# Patient Record
Sex: Female | Born: 1960 | Race: White | Hispanic: No | Marital: Married | Smoking: Never smoker
Health system: Southern US, Community
[De-identification: ages and names within clinical notes are randomized; demographics above are authoritative.]

## PROBLEM LIST (undated history)

## (undated) DIAGNOSIS — K635 Polyp of colon: Secondary | ICD-10-CM

## (undated) DIAGNOSIS — N200 Calculus of kidney: Secondary | ICD-10-CM

## (undated) HISTORY — PX: WISDOM TOOTH EXTRACTION: SHX21

## (undated) HISTORY — DX: Calculus of kidney: N20.0

## (undated) HISTORY — DX: Polyp of colon: K63.5

---

## 2005-05-29 ENCOUNTER — Ambulatory Visit: Payer: Self-pay | Admitting: Unknown Physician Specialty

## 2006-05-30 ENCOUNTER — Ambulatory Visit: Payer: Self-pay | Admitting: Unknown Physician Specialty

## 2006-06-05 ENCOUNTER — Ambulatory Visit: Payer: Self-pay | Admitting: Unknown Physician Specialty

## 2007-06-16 ENCOUNTER — Ambulatory Visit: Payer: Self-pay | Admitting: Unknown Physician Specialty

## 2008-06-18 ENCOUNTER — Ambulatory Visit: Payer: Self-pay | Admitting: Unknown Physician Specialty

## 2009-06-20 ENCOUNTER — Ambulatory Visit: Payer: Self-pay | Admitting: Unknown Physician Specialty

## 2009-09-03 HISTORY — PX: BREAST BIOPSY: SHX20

## 2010-07-21 ENCOUNTER — Ambulatory Visit: Payer: Self-pay | Admitting: Unknown Physician Specialty

## 2010-07-31 ENCOUNTER — Ambulatory Visit: Payer: Self-pay | Admitting: Unknown Physician Specialty

## 2010-09-11 ENCOUNTER — Ambulatory Visit: Payer: Self-pay | Admitting: General Surgery

## 2010-09-13 LAB — PATHOLOGY REPORT

## 2012-09-03 HISTORY — PX: BREAST SURGERY: SHX581

## 2013-07-02 ENCOUNTER — Ambulatory Visit: Payer: Self-pay | Admitting: Unknown Physician Specialty

## 2015-02-02 HISTORY — PX: COLONOSCOPY: SHX174

## 2015-05-17 LAB — HM MAMMOGRAPHY: HM Mammogram: NEGATIVE

## 2015-05-18 LAB — CBC AND DIFFERENTIAL
HCT: 39 % (ref 36–46)
HEMOGLOBIN: 13.4 g/dL (ref 12.0–16.0)
Neutrophils Absolute: 3 /uL
Platelets: 216 10*3/uL (ref 150–399)
WBC: 5 10^3/mL

## 2015-05-18 LAB — BASIC METABOLIC PANEL
BUN: 18 mg/dL (ref 4–21)
CREATININE: 0.8 mg/dL (ref 0.5–1.1)
Glucose: 88 mg/dL
POTASSIUM: 4.3 mmol/L (ref 3.4–5.3)
SODIUM: 138 mmol/L (ref 137–147)

## 2015-05-18 LAB — HEPATIC FUNCTION PANEL
ALT: 13 U/L (ref 7–35)
AST: 15 U/L (ref 13–35)
Alkaline Phosphatase: 69 U/L (ref 25–125)
Bilirubin, Total: 0.5 mg/dL

## 2015-05-18 LAB — HEMOGLOBIN A1C: HEMOGLOBIN A1C: 5.4

## 2015-05-18 LAB — LIPID PANEL
CHOLESTEROL: 193 mg/dL (ref 0–200)
HDL: 69 mg/dL (ref 35–70)
LDL Cholesterol: 106 mg/dL
TRIGLYCERIDES: 92 mg/dL (ref 40–160)

## 2015-05-18 LAB — TSH: TSH: 1.31 u[IU]/mL (ref 0.41–5.90)

## 2015-09-27 ENCOUNTER — Encounter: Payer: Self-pay | Admitting: Family Medicine

## 2015-09-27 ENCOUNTER — Ambulatory Visit (INDEPENDENT_AMBULATORY_CARE_PROVIDER_SITE_OTHER): Payer: 59 | Admitting: Family Medicine

## 2015-09-27 VITALS — BP 102/76 | HR 73 | Temp 97.9°F | Ht 64.5 in | Wt 122.2 lb

## 2015-09-27 DIAGNOSIS — Z87440 Personal history of urinary (tract) infections: Secondary | ICD-10-CM

## 2015-09-27 DIAGNOSIS — Z0000 Encounter for general adult medical examination without abnormal findings: Secondary | ICD-10-CM | POA: Insufficient documentation

## 2015-09-27 DIAGNOSIS — Z13220 Encounter for screening for lipoid disorders: Secondary | ICD-10-CM

## 2015-09-27 DIAGNOSIS — Z130 Encounter for screening for diseases of the blood and blood-forming organs and certain disorders involving the immune mechanism: Secondary | ICD-10-CM | POA: Diagnosis not present

## 2015-09-27 LAB — CBC
HEMATOCRIT: 40.8 % (ref 36.0–46.0)
HEMOGLOBIN: 13.4 g/dL (ref 12.0–15.0)
MCHC: 32.9 g/dL (ref 30.0–36.0)
MCV: 89 fl (ref 78.0–100.0)
PLATELETS: 215 10*3/uL (ref 150.0–400.0)
RBC: 4.58 Mil/uL (ref 3.87–5.11)
RDW: 13.8 % (ref 11.5–15.5)
WBC: 6.5 10*3/uL (ref 4.0–10.5)

## 2015-09-27 LAB — LIPID PANEL
Cholesterol: 179 mg/dL (ref 0–200)
HDL: 66.3 mg/dL (ref >39.00)
LDL Cholesterol: 94 mg/dL (ref 0–99)
NONHDL: 112.42
TRIGLYCERIDES: 91 mg/dL (ref 0.0–149.0)
Total CHOL/HDL Ratio: 3
VLDL: 18.2 mg/dL (ref 0.0–40.0)

## 2015-09-27 LAB — COMPREHENSIVE METABOLIC PANEL
ALT: 18 U/L (ref 0–35)
AST: 19 U/L (ref 0–37)
Albumin: 4.7 g/dL (ref 3.5–5.2)
Alkaline Phosphatase: 59 U/L (ref 39–117)
BUN: 16 mg/dL (ref 6–23)
CALCIUM: 9.7 mg/dL (ref 8.4–10.5)
CHLORIDE: 103 meq/L (ref 96–112)
CO2: 29 mEq/L (ref 19–32)
Creatinine, Ser: 0.76 mg/dL (ref 0.40–1.20)
GFR: 84.61 mL/min (ref >60.00)
Glucose, Bld: 98 mg/dL (ref 70–99)
Potassium: 5.4 mEq/L — ABNORMAL HIGH (ref 3.5–5.1)
Sodium: 138 mEq/L (ref 135–145)
Total Bilirubin: 0.5 mg/dL (ref 0.2–1.2)
Total Protein: 7.1 g/dL (ref 6.0–8.3)

## 2015-09-27 NOTE — Assessment & Plan Note (Signed)
Declines flu and Tdap. Pap smear and mammogram up-to-date. Colonoscopy also up-to-date. Screening lab work today.

## 2015-09-27 NOTE — Progress Notes (Signed)
Pre visit review using our clinic review tool, if applicable. No additional management support is needed unless otherwise documented below in the visit note. 

## 2015-09-27 NOTE — Patient Instructions (Signed)
It was nice to see you today.  We will call with your lab results.  Follow up annually or sooner if needed.  Take care  Dr. Lacinda Montoya  Health Maintenance, Female Adopting a healthy lifestyle and getting preventive care can go a long way to promote health and wellness. Talk with your health care provider about what schedule of regular examinations is right for you. This is a good chance for you to check in with your provider about disease prevention and staying healthy. In between checkups, there are plenty of things you can do on your own. Experts have done a lot of research about which lifestyle changes and preventive measures are most likely to keep you healthy. Ask your health care provider for more information. WEIGHT AND DIET  Eat a healthy diet  Be sure to include plenty of vegetables, fruits, low-fat dairy products, and lean protein.  Do not eat a lot of foods high in solid fats, added sugars, or salt.  Get regular exercise. This is one of the most important things you can do for your health.  Most adults should exercise for at least 150 minutes each week. The exercise should increase your heart rate and make you sweat (moderate-intensity exercise).  Most adults should also do strengthening exercises at least twice a week. This is in addition to the moderate-intensity exercise.  Maintain a healthy weight  Body mass index (BMI) is a measurement that can be used to identify possible weight problems. It estimates body fat based on height and weight. Your health care provider can help determine your BMI and help you achieve or maintain a healthy weight.  For females 73 years of age and older:   A BMI below 18.5 is considered underweight.  A BMI of 18.5 to 24.9 is normal.  A BMI of 25 to 29.9 is considered overweight.  A BMI of 30 and above is considered obese.  Watch levels of cholesterol and blood lipids  You should start having your blood tested for lipids and cholesterol  at 53 years of age, then have this test every 5 years.  You may need to have your cholesterol levels checked more often if:  Your lipid or cholesterol levels are high.  You are older than 53 years of age.  You are at high risk for heart disease.  CANCER SCREENING   Lung Cancer  Lung cancer screening is recommended for adults 48-55 years old who are at high risk for lung cancer because of a history of smoking.  A yearly low-dose CT scan of the lungs is recommended for people who:  Currently smoke.  Have quit within the past 15 years.  Have at least a 30-pack-year history of smoking. A pack year is smoking an average of one pack of cigarettes a day for 1 year.  Yearly screening should continue until it has been 15 years since you quit.  Yearly screening should stop if you develop a health problem that would prevent you from having lung cancer treatment.  Breast Cancer  Practice breast self-awareness. This means understanding how your breasts normally appear and feel.  It also means doing regular breast self-exams. Let your health care provider know about any changes, no matter how small.  If you are in your 20s or 30s, you should have a clinical breast exam (CBE) by a health care provider every 1-3 years as part of a regular health exam.  If you are 55 or older, have a CBE every year. Also  consider having a breast X-ray (mammogram) every year.  If you have a family history of breast cancer, talk to your health care provider about genetic screening.  If you are at high risk for breast cancer, talk to your health care provider about having an MRI and a mammogram every year.  Breast cancer gene (BRCA) assessment is recommended for women who have family members with BRCA-related cancers. BRCA-related cancers include:  Breast.  Ovarian.  Tubal.  Peritoneal cancers.  Results of the assessment will determine the need for genetic counseling and BRCA1 and BRCA2  testing. Cervical Cancer Your health care provider may recommend that you be screened regularly for cancer of the pelvic organs (ovaries, uterus, and vagina). This screening involves a pelvic examination, including checking for microscopic changes to the surface of your cervix (Pap test). You may be encouraged to have this screening done every 3 years, beginning at age 31.  For women ages 67-65, health care providers may recommend pelvic exams and Pap testing every 3 years, or they may recommend the Pap and pelvic exam, combined with testing for human papilloma virus (HPV), every 5 years. Some types of HPV increase your risk of cervical cancer. Testing for HPV may also be done on women of any age with unclear Pap test results.  Other health care providers may not recommend any screening for nonpregnant women who are considered low risk for pelvic cancer and who do not have symptoms. Ask your health care provider if a screening pelvic exam is right for you.  If you have had past treatment for cervical cancer or a condition that could lead to cancer, you need Pap tests and screening for cancer for at least 20 years after your treatment. If Pap tests have been discontinued, your risk factors (such as having a new sexual partner) need to be reassessed to determine if screening should resume. Some women have medical problems that increase the chance of getting cervical cancer. In these cases, your health care provider may recommend more frequent screening and Pap tests. Colorectal Cancer  This type of cancer can be detected and often prevented.  Routine colorectal cancer screening usually begins at 53 years of age and continues through 53 years of age.  Your health care provider may recommend screening at an earlier age if you have risk factors for colon cancer.  Your health care provider may also recommend using home test kits to check for hidden blood in the stool.  A small camera at the end of a  tube can be used to examine your colon directly (sigmoidoscopy or colonoscopy). This is done to check for the earliest forms of colorectal cancer.  Routine screening usually begins at age 25.  Direct examination of the colon should be repeated every 5-10 years through 53 years of age. However, you may need to be screened more often if early forms of precancerous polyps or small growths are found. Skin Cancer  Check your skin from head to toe regularly.  Tell your health care provider about any new moles or changes in moles, especially if there is a change in a mole's shape or color.  Also tell your health care provider if you have a mole that is larger than the size of a pencil eraser.  Always use sunscreen. Apply sunscreen liberally and repeatedly throughout the day.  Protect yourself by wearing long sleeves, pants, a wide-brimmed hat, and sunglasses whenever you are outside. HEART DISEASE, DIABETES, AND HIGH BLOOD PRESSURE   High  blood pressure causes heart disease and increases the risk of stroke. High blood pressure is more likely to develop in:  People who have blood pressure in the high end of the normal range (130-139/85-89 mm Hg).  People who are overweight or obese.  People who are African American.  If you are 30-90 years of age, have your blood pressure checked every 3-5 years. If you are 83 years of age or older, have your blood pressure checked every year. You should have your blood pressure measured twice--once when you are at a hospital or clinic, and once when you are not at a hospital or clinic. Record the average of the two measurements. To check your blood pressure when you are not at a hospital or clinic, you can use:  An automated blood pressure machine at a pharmacy.  A home blood pressure monitor.  If you are between 66 years and 43 years old, ask your health care provider if you should take aspirin to prevent strokes.  Have regular diabetes screenings. This  involves taking a blood sample to check your fasting blood sugar level.  If you are at a normal weight and have a low risk for diabetes, have this test once every three years after 53 years of age.  If you are overweight and have a high risk for diabetes, consider being tested at a younger age or more often. PREVENTING INFECTION  Hepatitis B  If you have a higher risk for hepatitis B, you should be screened for this virus. You are considered at high risk for hepatitis B if:  You were born in a country where hepatitis B is common. Ask your health care provider which countries are considered high risk.  Your parents were born in a high-risk country, and you have not been immunized against hepatitis B (hepatitis B vaccine).  You have HIV or AIDS.  You use needles to inject street drugs.  You live with someone who has hepatitis B.  You have had sex with someone who has hepatitis B.  You get hemodialysis treatment.  You take certain medicines for conditions, including cancer, organ transplantation, and autoimmune conditions. Hepatitis C  Blood testing is recommended for:  Everyone born from [birth year] through 1963.  Anyone with known risk factors for hepatitis C. Sexually transmitted infections (STIs)  You should be screened for sexually transmitted infections (STIs) including gonorrhea and chlamydia if:  You are sexually active and are younger than 53 years of age.  You are older than 53 years of age and your health care provider tells you that you are at risk for this type of infection.  Your sexual activity has changed since you were last screened and you are at an increased risk for chlamydia or gonorrhea. Ask your health care provider if you are at risk.  If you do not have HIV, but are at risk, it may be recommended that you take a prescription medicine daily to prevent HIV infection. This is called pre-exposure prophylaxis (PrEP). You are considered at risk if:  You are  sexually active and do not regularly use condoms or know the HIV status of your partner(s).  You take drugs by injection.  You are sexually active with a partner who has HIV. Talk with your health care provider about whether you are at high risk of being infected with HIV. If you choose to begin PrEP, you should first be tested for HIV. You should then be tested every 3 months for as long  as you are taking PrEP.  PREGNANCY   If you are premenopausal and you may become pregnant, ask your health care provider about preconception counseling.  If you may become pregnant, take 400 to 800 micrograms (mcg) of folic acid every day.  If you want to prevent pregnancy, talk to your health care provider about birth control (contraception). OSTEOPOROSIS AND MENOPAUSE   Osteoporosis is a disease in which the bones lose minerals and strength with aging. This can result in serious bone fractures. Your risk for osteoporosis can be identified using a bone density scan.  If you are 20 years of age or older, or if you are at risk for osteoporosis and fractures, ask your health care provider if you should be screened.  Ask your health care provider whether you should take a calcium or vitamin D supplement to lower your risk for osteoporosis.  Menopause may have certain physical symptoms and risks.  Hormone replacement therapy may reduce some of these symptoms and risks. Talk to your health care provider about whether hormone replacement therapy is right for you.  HOME CARE INSTRUCTIONS   Schedule regular health, dental, and eye exams.  Stay current with your immunizations.   Do not use any tobacco products including cigarettes, chewing tobacco, or electronic cigarettes.  If you are pregnant, do not drink alcohol.  If you are breastfeeding, limit how much and how often you drink alcohol.  Limit alcohol intake to no more than 1 drink per day for nonpregnant women. One drink equals 12 ounces of beer, 5  ounces of wine, or 1 ounces of hard liquor.  Do not use street drugs.  Do not share needles.  Ask your health care provider for help if you need support or information about quitting drugs.  Tell your health care provider if you often feel depressed.  Tell your health care provider if you have ever been abused or do not feel safe at home.   This information is not intended to replace advice given to you by your health care provider. Make sure you discuss any questions you have with your health care provider.   Document Released: 03/05/2011 Document Revised: 09/10/2014 Document Reviewed: 07/22/2013 Elsevier Interactive Patient Education [employer].

## 2015-09-27 NOTE — Progress Notes (Signed)
 Subjective:  Patient ID: Lindsey Montoya, female    DOB: 02/23/1961  Age: 55 y.o. MRN: [id number]  CC: Establish care.  HPI Lindsey Montoya is a 55 y.o. female presents to the clinic today to establish care.  Preventative Healthcare  Pap smear: Up-to-date. June 2016. Followed by GYN.  Mammogram: June 2016. Up-to-date.  Colonoscopy: Up-to-date. Done earlier in 2016.  Immunizations  Tetanus - declines.  Pneumococcal - not indicated.  Flu - declines.  Zoster - not indicated  Hepatitis C screening - declines. Patient did not want screening lab work today but I encouraged her to get basic lab work today and she agreed to this.  Labs: See above.   Exercise: No regular exercise.  Alcohol use: See below.   Smoking/tobacco use: No.  STD/HIV testing: Declineds.  Regular dental exams: Yes.   Wears seat belt: Yes.   PMH, Surgical Hx, Family Hx, Social History reviewed and updated as below.  Past Medical History  Diagnosis Date  . Colon polyps   . Kidney stones    Past Surgical History  Procedure Laterality Date  . Breast surgery  2014    biopsy   Family History  Problem Relation Age of Onset  . Hypertension Mother   . Diabetes Mother   . Hypertension Father   . Breast cancer Paternal Grandmother    Social History  Substance Use Topics  . Smoking status: Never Smoker   . Smokeless tobacco: Never Used  . Alcohol Use: 0.0 - 1.2 oz/week    0-1 Standard drinks or equivalent, 0-1 Glasses of wine per week   Review of Systems Comprehensive review of systems was done today and was negative. See scanned document. Objective:   Today's Vitals: BP 102/76 mmHg  Pulse 73  Temp(Src) 97.9 F (36.6 C) (Oral)  Ht 5' 4.5" (1.638 m)  Wt 122 lb 4 oz (55.452 kg)  BMI 20.67 kg/m2  SpO2 100%  Physical Exam  Constitutional: She is oriented to person, place, and time. She appears well-developed and well-nourished. No distress.  HENT:  Head: Normocephalic and  atraumatic.  Nose: Nose normal.  Mouth/Throat: Oropharynx is clear and moist. No oropharyngeal exudate.  Normal TM's bilaterally.   Eyes: Conjunctivae are normal. No scleral icterus.  Neck: Neck supple. No thyromegaly present.  Cardiovascular: Normal rate and regular rhythm.   No murmur heard. Pulmonary/Chest: Effort normal and breath sounds normal. She has no wheezes. She has no rales.  Abdominal: Soft. She exhibits no distension. There is no tenderness. There is no rebound and no guarding.  Musculoskeletal: Normal range of motion. She exhibits no edema.  Lymphadenopathy:    She has no cervical adenopathy.  Neurological: She is alert and oriented to person, place, and time.  Skin: Skin is warm and dry. No rash noted.  Psychiatric: She has a normal mood and affect.  Vitals reviewed.  Assessment & Plan:   Problem List Items Addressed This Visit    Preventative health care    Declines flu and Tdap. Pap smear and mammogram up-to-date. Colonoscopy also up-to-date. Screening lab work today.       Other Visit Diagnoses    Screening for deficiency anemia    -  Primary    Relevant Orders    CBC (Completed)    Screening, lipid        Relevant Orders    Lipid panel (Completed)    History of UTI        Relevant Orders      Comp Met (CMET) (Completed)       Follow-up: Return in about 1 year (around 09/26/2016).  [place]

## 2015-10-19 ENCOUNTER — Encounter: Payer: Self-pay | Admitting: Family Medicine

## 2016-08-17 IMAGING — MG SCREENING-BILATERAL MAMMOGRAPHY
5 series · 5 of 5 positions shown · non-contrast
Comparison: none

_____________________________________________________

ALTO, ZAJK 07/18/1927 MILL, IBRAHIM MUHAMMED [DATE]
REASON FOR CONSULTATION: Last Menstral Date:...... POST MENSTRUAL Reason
for Consultation:. FOLLOW UP Comment to Radiology:....
_______________________________________________________
EXAM: PORTABLE CHEST
Nasogastric tube is present. Central venous line tip is in the area
just above the RIGHT atrium. The RIGHT lung is essentially clear. There
is abnormal opacity in the LEFT lung base obscuring the LEFT
hemidiaphragm and blunting of the LEFT costophrenic angle. This is
probable due to pleural effusion plus possibly infiltrate in the LEFT
lower lobe. The heart size is normal.

[R CC]
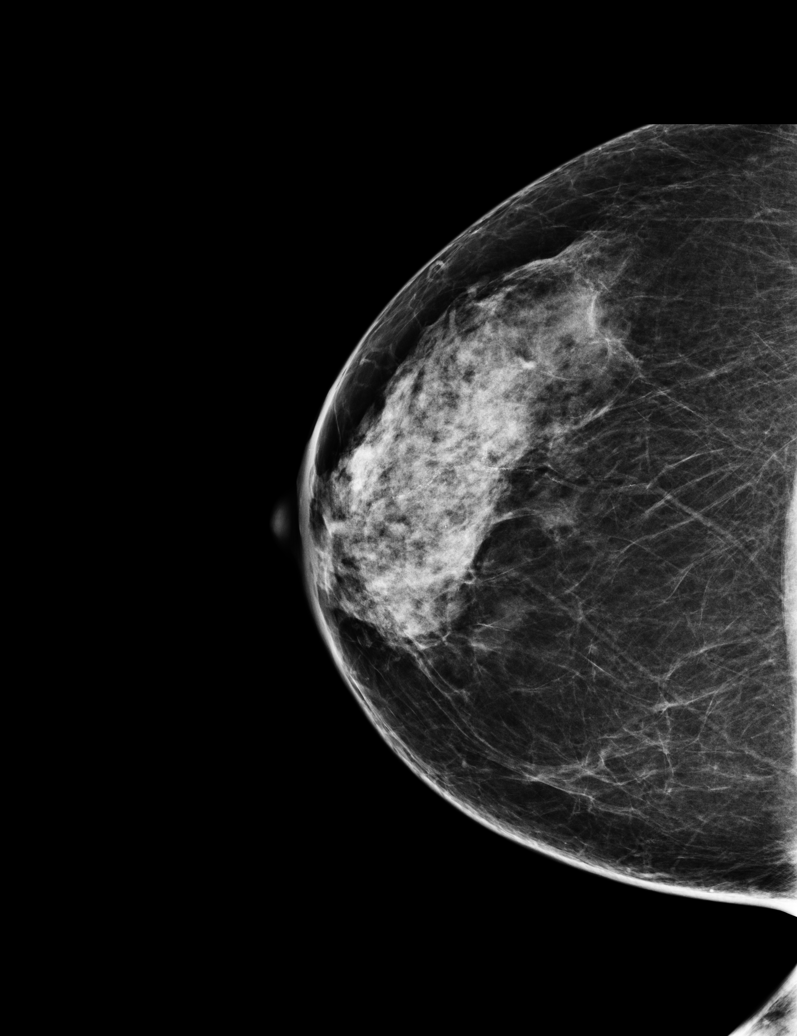

[L CC]
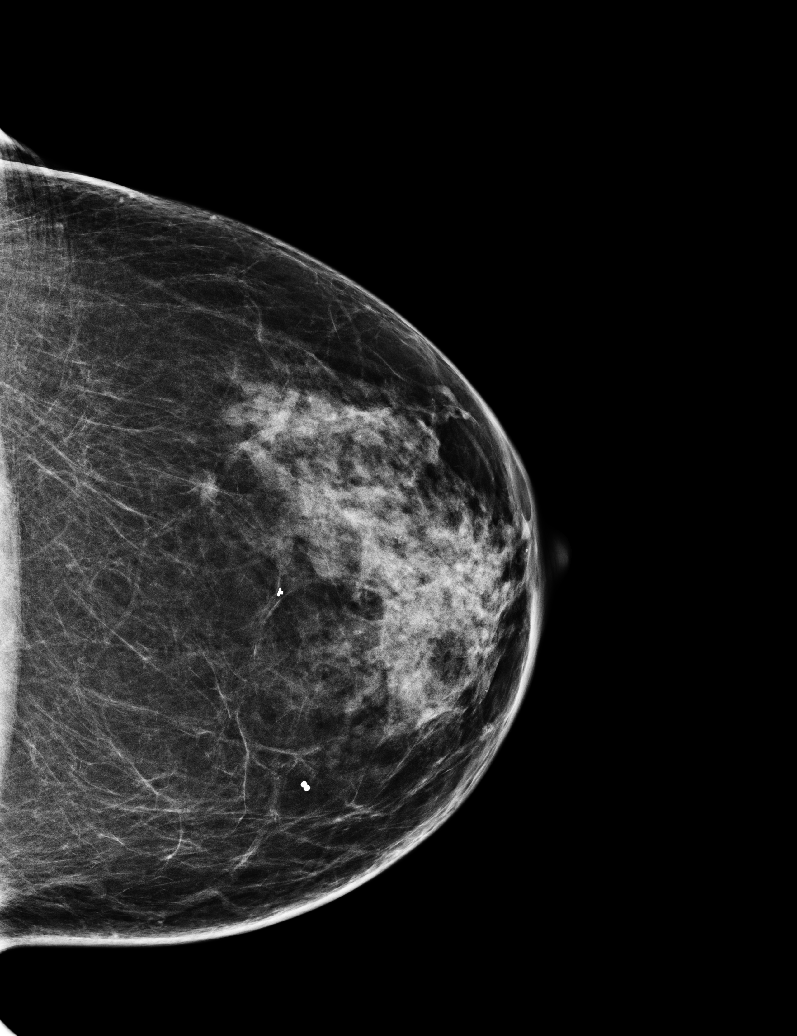

[L MLO]
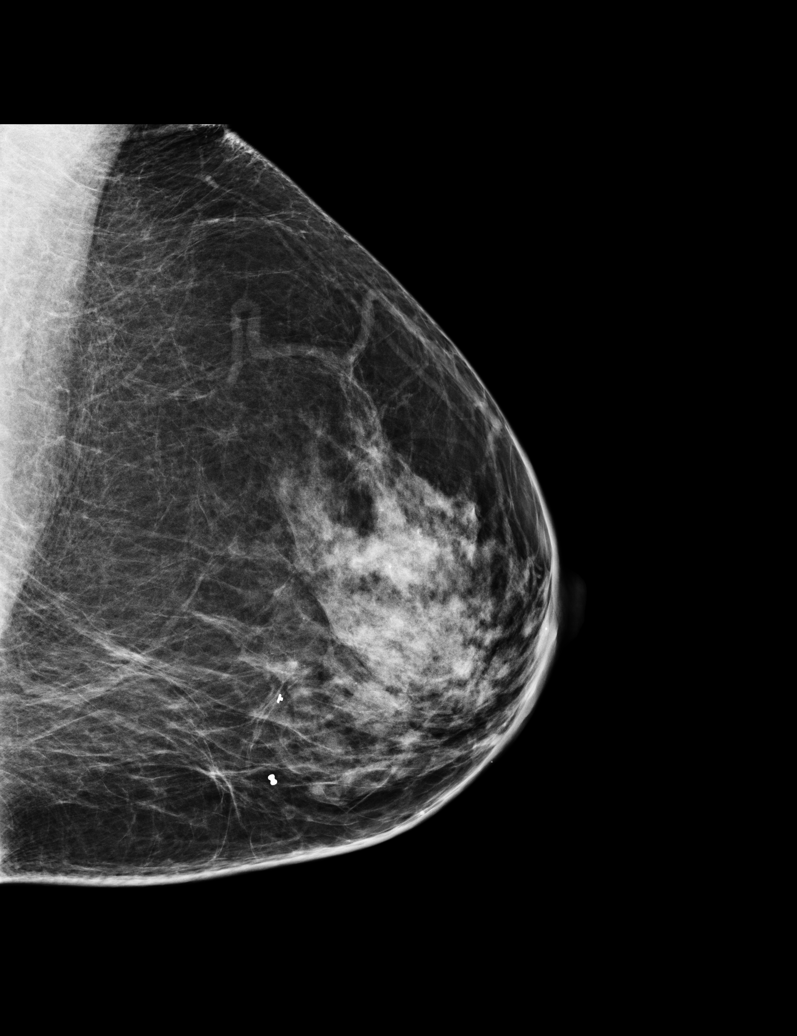

[R MLO (1 of 2)]
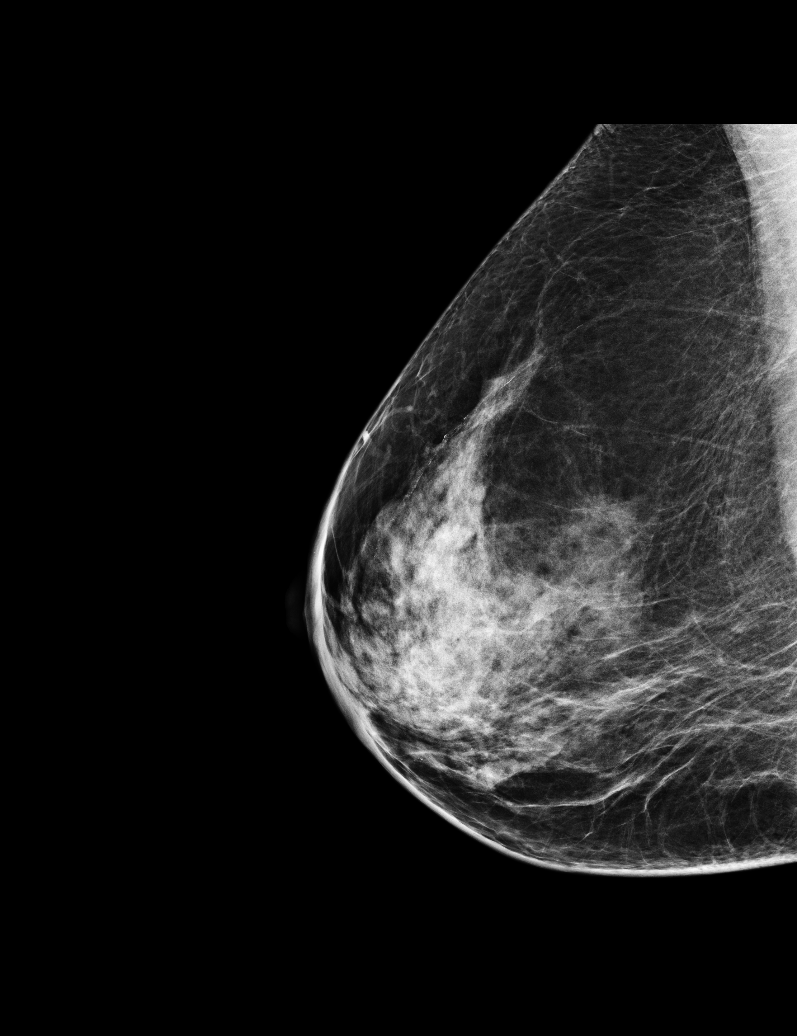

[R MLO (2 of 2)]
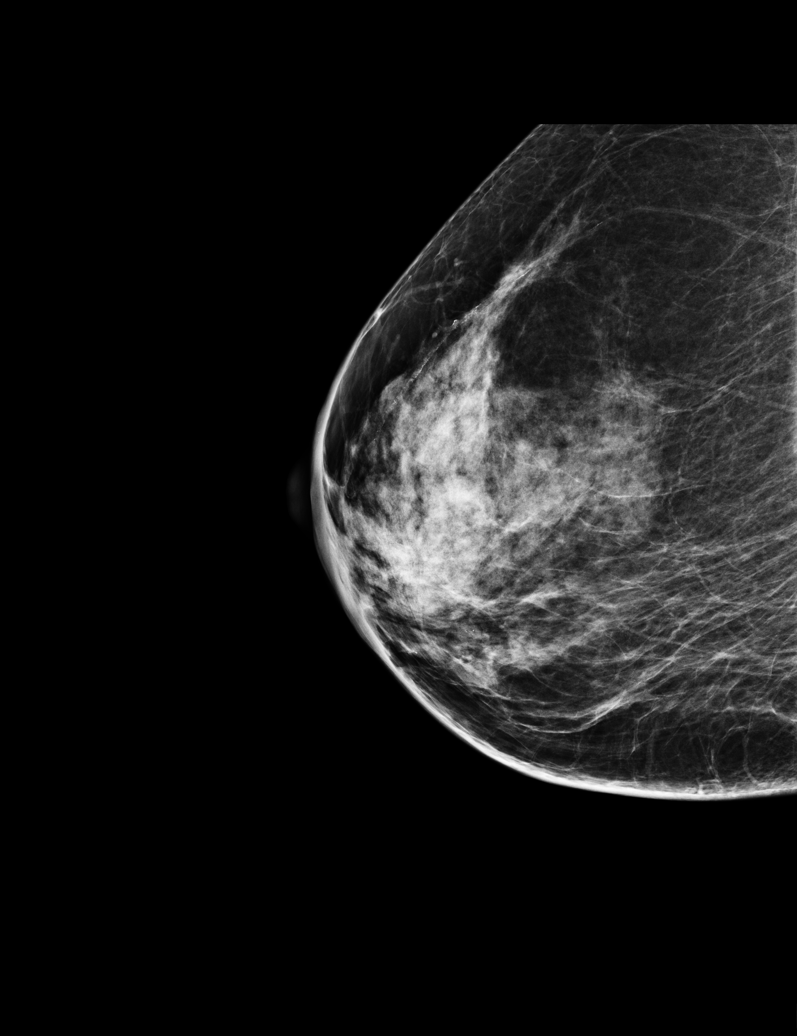

[5 of 5 positions shown; findings below may reference images not displayed]

IMPRESSION: Worsening LEFT pleural effusion and probably alveolar
infiltrate in the LEFT lower lobe

 Patient: ALTO, ZAJK [DATE]

## 2016-12-26 ENCOUNTER — Ambulatory Visit (INDEPENDENT_AMBULATORY_CARE_PROVIDER_SITE_OTHER): Payer: 59 | Admitting: Obstetrics and Gynecology

## 2016-12-26 ENCOUNTER — Encounter: Payer: Self-pay | Admitting: Obstetrics and Gynecology

## 2016-12-26 VITALS — BP 100/70 | HR 72 | Ht 65.0 in | Wt 125.0 lb

## 2016-12-26 DIAGNOSIS — Z1231 Encounter for screening mammogram for malignant neoplasm of breast: Secondary | ICD-10-CM | POA: Diagnosis not present

## 2016-12-26 DIAGNOSIS — Z1151 Encounter for screening for human papillomavirus (HPV): Secondary | ICD-10-CM | POA: Diagnosis not present

## 2016-12-26 DIAGNOSIS — Z30431 Encounter for routine checking of intrauterine contraceptive device: Secondary | ICD-10-CM | POA: Diagnosis not present

## 2016-12-26 DIAGNOSIS — Z124 Encounter for screening for malignant neoplasm of cervix: Secondary | ICD-10-CM | POA: Diagnosis not present

## 2016-12-26 DIAGNOSIS — Z01419 Encounter for gynecological examination (general) (routine) without abnormal findings: Secondary | ICD-10-CM | POA: Diagnosis not present

## 2016-12-26 DIAGNOSIS — Z1239 Encounter for other screening for malignant neoplasm of breast: Secondary | ICD-10-CM

## 2016-12-26 NOTE — Progress Notes (Signed)
Chief Complaint  Patient presents with  . Gynecologic Exam    HPI:      Ms. Lindsey Montoya is a 56 y.o. [id number] who LMP was No LMP recorded. Patient is postmenopausal., presents today for her annual examination.  Her menses are absent due to IUD and possibly age.  Dysmenorrhea none. She does not have intermenstrual bleeding.  Mirena placed 08/08/13  She does have vasomotor sx.  Sex activity: single partner, contraception - IUD. She does have vaginal dryness.  Last Pap: April 14, 2013  Results were: no abnormalities /neg HPV DNA.    Last mammogram: May 17, 2015  Results were: normal--routine follow-up in 12 months There is a FH of breast cancer in her PGM, genetic testing not indicated. There is no FH of ovarian cancer. The patient does not do self-breast exams.  Colonoscopy: colonoscopy 2 years ago without abnormalities.   Tobacco use: The patient denies current or previous tobacco use. Alcohol use: social drinker Exercise: moderately active  She does not get adequate calcium and Vitamin D in her diet.   Past Medical History:  Diagnosis Date  . Colon polyps   . Kidney stones     Past Surgical History:  Procedure Laterality Date  . BREAST SURGERY  2014   biopsy  . COLONOSCOPY  02/2015  . WISDOM TOOTH EXTRACTION     THREE;  TEENAGER ON TWO AND 40s on the third when she got braces    Family History  Problem Relation Age of Onset  . Hypertension Mother   . Diabetes Mother     DIED 04-14-15  . Hypertension Father   . Breast cancer Paternal Grandmother     Social History   Social History  . Marital status: Married    Spouse name: N/A  . Number of children: N/A  . Years of education: N/A   Occupational History  . Not on file.   Social History Main Topics  . Smoking status: Never Smoker  . Smokeless tobacco: Never Used  . Alcohol use 0.0 - 1.2 oz/week  . Drug use: No  . Sexual activity: Yes   Other Topics Concern  . Not on file   Social  History Narrative  . No narrative on file    No current outpatient prescriptions on file.   ROS:  Review of Systems  Constitutional: Negative for fever, malaise/fatigue and weight loss.  HENT: Negative for congestion, ear pain and sinus pain.   Respiratory: Negative for cough, shortness of breath and wheezing.   Cardiovascular: Negative for chest pain, orthopnea and leg swelling.  Gastrointestinal: Negative for constipation, diarrhea, nausea and vomiting.  Genitourinary: Negative for dysuria, frequency, hematuria and urgency.       Breast ROS: negative   Musculoskeletal: Negative for back pain, joint pain and myalgias.  Skin: Negative for itching and rash.  Neurological: Negative for dizziness, tingling, focal weakness and headaches.  Endo/Heme/Allergies: Negative for environmental allergies. Does not bruise/bleed easily.  Psychiatric/Behavioral: Negative for depression and suicidal ideas. The patient is not nervous/anxious and does not have insomnia.     Objective: BP 100/70 (Patient Position: Sitting)   Pulse 72   Ht 5\' 5"  (1.651 m)   Wt 125 lb (56.7 kg)   BMI 20.80 kg/m    Physical Exam  Constitutional: She is oriented to person, place, and time. She appears well-developed and well-nourished.  Genitourinary: Vagina normal and uterus normal. No erythema or tenderness in the vagina. No vaginal discharge found. Right adnexum  does not display mass and does not display tenderness. Left adnexum does not display mass and does not display tenderness.  Cervix exhibits visible IUD strings. Cervix does not exhibit motion tenderness or polyp. Uterus is not enlarged or tender.  Neck: Normal range of motion. No thyromegaly present.  Cardiovascular: Normal rate, regular rhythm and normal heart sounds.   No murmur heard. Pulmonary/Chest: Effort normal and breath sounds normal. Right breast exhibits no mass, no nipple discharge, no skin change and no tenderness. Left breast exhibits no  mass, no nipple discharge, no skin change and no tenderness.  Abdominal: Soft. There is no tenderness. There is no guarding.  Musculoskeletal: Normal range of motion.  Neurological: She is alert and oriented to person, place, and time. No cranial nerve deficit.  Psychiatric: She has a normal mood and affect. Her behavior is normal.  Vitals reviewed.    Assessment/Plan:  Encounter for annual routine gynecological examination  Cervical cancer screening - Plan: IGP, Aptima HPV  Screening for HPV (human papillomavirus) - Plan: IGP, Aptima HPV  Screening for breast cancer - Pt to sched mammo - Plan: MM DIGITAL SCREENING BILATERAL  Encounter for routine checking of intrauterine contraceptive device (IUD) - IUD in place. Due for rem 12/19. Discussed pros/cons of removing now since no menses. Will remove next yr when due for rem. Pt without side effects/complaints.          GYN counsel mammography screening, menopause, adequate intake of calcium and vitamin D, diet and exercise     F/U  Return in about 1 year (around 12/26/2017).  Alicia B. Copland, PA-C 12/26/2016 9:31 AM

## 2016-12-28 LAB — IGP, APTIMA HPV
HPV APTIMA: NEGATIVE
PAP Smear Comment: 0

## 2017-01-25 ENCOUNTER — Ambulatory Visit (INDEPENDENT_AMBULATORY_CARE_PROVIDER_SITE_OTHER): Payer: 59 | Admitting: Family Medicine

## 2017-01-25 ENCOUNTER — Encounter: Payer: Self-pay | Admitting: Family Medicine

## 2017-01-25 DIAGNOSIS — L03213 Periorbital cellulitis: Secondary | ICD-10-CM | POA: Diagnosis not present

## 2017-01-25 MED ORDER — SULFAMETHOXAZOLE-TRIMETHOPRIM 800-160 MG PO TABS: 1.0000 | ORAL_TABLET | Freq: Three times a day (TID) | ORAL | 0 refills | Status: AC

## 2017-01-25 MED ORDER — AMOXICILLIN-POT CLAVULANATE 875-125 MG PO TABS: 1.0000 | ORAL_TABLET | Freq: Two times a day (BID) | ORAL | 0 refills | Status: AC

## 2017-01-25 NOTE — Patient Instructions (Signed)
Nice to see you. I believe you likely have infection of the eyelids on her right side. We will start you on Augmentin and Bactrim to cover for infection. You should take a probiotic or eat yogurt with this. If you develop worsening swelling, or you develop eye pain fevers, or any new or changing symptoms please seek medical attention immediately.

## 2017-01-25 NOTE — Assessment & Plan Note (Signed)
Involving the upper and lower eyelid. Vision is intact. No red flag symptoms. Not consistent with orbital cellulitis. We will provide coverage with Augmentin and Bactrim to provide MRSA and streptococcal coverage. She can continue cold compresses. Discussed return precautions. She'll follow-up early next week for recheck.

## 2017-01-25 NOTE — Progress Notes (Signed)
  Tommi Rumps, MD Phone: [phone]  Lindsey Montoya is a 56 y.o. female who presents today for same-day visit.  Notes several days ago she was working outside in the yard and then felt as though something had irritated her eyes. She took her contact out and then noted her eyelids on the right side began to swell. Possibly felt as though something bit her medially in the eyelid. She notes they've been itchy and watery and have also been crusting. She notes no fever or vision changes. No photophobia. No eye pain. No foreign body sensation. Was close to swollen shut earlier today. Has improved to some degree recently. She spoke with her eye doctor and they advised taking Benadryl yesterday. They also advised if not improved she should be evaluated.  PMH: nonsmoker.   ROS see history of present illness  Objective  Physical Exam Vitals:   01/25/17 0927  BP: 128/78  Pulse: 89  Resp: 16  Temp: 98.6 F (37 C)    BP Readings from Last 3 Encounters:  01/25/17 128/78  12/26/16 100/70  09/27/15 102/76   Wt Readings from Last 3 Encounters:  01/25/17 120 lb (54.4 kg)  12/26/16 125 lb (56.7 kg)  09/27/15 122 lb 4 oz (55.5 kg)    Physical Exam  Constitutional: No distress.  HENT:  Head: Normocephalic and atraumatic.  Eyes: Conjunctivae are normal. Pupils are equal, round, and reactive to light.  Fairly significant periorbital swelling and mild erythema of the upper and lower eyelids, she is able to open the eye, minimal warmth to the eyelids, no tenderness, extraocular motion intact with no discomfort, no gross corneal or iris or pupillary changes on exam  Cardiovascular: Normal rate, regular rhythm and normal heart sounds.   Pulmonary/Chest: Effort normal and breath sounds normal.  Skin: She is not diaphoretic.     Assessment/Plan: Please see individual problem list.  Preseptal cellulitis of right eye Involving the upper and lower eyelid. Vision is intact. No red flag  symptoms. Not consistent with orbital cellulitis. We will provide coverage with Augmentin and Bactrim to provide MRSA and streptococcal coverage. She can continue cold compresses. Discussed return precautions. She'll follow-up early next week for recheck.   No orders of the defined types were placed in this encounter.   Meds ordered this encounter  Medications  . amoxicillin-clavulanate (AUGMENTIN) 875-125 MG tablet    Sig: Take 1 tablet by mouth 2 (two) times daily.    Dispense:  10 tablet    Refill:  0  . sulfamethoxazole-trimethoprim (BACTRIM DS,SEPTRA DS) 800-160 MG tablet    Sig: Take 1 tablet by mouth 3 (three) times daily.    Dispense:  15 tablet    Refill:  0    Tommi Rumps, MD [place]

## 2017-01-29 ENCOUNTER — Ambulatory Visit: Payer: Self-pay | Admitting: Family Medicine

## 2017-04-02 ENCOUNTER — Telehealth: Payer: Self-pay | Admitting: Family Medicine

## 2017-04-02 NOTE — Telephone Encounter (Signed)
No show fee has been removed from the patient's account.

## 2017-04-02 NOTE — Telephone Encounter (Signed)
Pt called in regards to a no show fee that she received for 5/29. Pt states that she called last week and has not heard anything. Pt states that she did try and cancel her appt but we were closed for 3 days because of the holiday. Please advise, thank you!  Call pt @ [phone]

## 2017-07-18 DIAGNOSIS — L578 Other skin changes due to chronic exposure to nonionizing radiation: Secondary | ICD-10-CM | POA: Diagnosis not present

## 2017-07-18 DIAGNOSIS — D485 Neoplasm of uncertain behavior of skin: Secondary | ICD-10-CM | POA: Diagnosis not present

## 2017-07-18 DIAGNOSIS — L821 Other seborrheic keratosis: Secondary | ICD-10-CM | POA: Diagnosis not present

## 2017-07-18 DIAGNOSIS — L812 Freckles: Secondary | ICD-10-CM | POA: Diagnosis not present

## 2017-07-18 DIAGNOSIS — L858 Other specified epidermal thickening: Secondary | ICD-10-CM | POA: Diagnosis not present

## 2017-07-18 DIAGNOSIS — L859 Epidermal thickening, unspecified: Secondary | ICD-10-CM | POA: Diagnosis not present

## 2018-02-18 ENCOUNTER — Ambulatory Visit (INDEPENDENT_AMBULATORY_CARE_PROVIDER_SITE_OTHER): Payer: 59 | Admitting: Obstetrics and Gynecology

## 2018-02-18 ENCOUNTER — Encounter: Payer: Self-pay | Admitting: Obstetrics and Gynecology

## 2018-02-18 VITALS — BP 120/80 | Ht 64.0 in | Wt 124.0 lb

## 2018-02-18 DIAGNOSIS — Z1231 Encounter for screening mammogram for malignant neoplasm of breast: Secondary | ICD-10-CM

## 2018-02-18 DIAGNOSIS — Z1239 Encounter for other screening for malignant neoplasm of breast: Secondary | ICD-10-CM

## 2018-02-18 DIAGNOSIS — Z01419 Encounter for gynecological examination (general) (routine) without abnormal findings: Secondary | ICD-10-CM | POA: Diagnosis not present

## 2018-02-18 DIAGNOSIS — N951 Menopausal and female climacteric states: Secondary | ICD-10-CM | POA: Diagnosis not present

## 2018-02-18 DIAGNOSIS — Z780 Asymptomatic menopausal state: Secondary | ICD-10-CM | POA: Diagnosis not present

## 2018-02-18 DIAGNOSIS — Z30432 Encounter for removal of intrauterine contraceptive device: Secondary | ICD-10-CM

## 2018-02-18 NOTE — Progress Notes (Signed)
Chief Complaint  Patient presents with  . Gynecologic Exam    iud removal     HPI:      Ms. Lindsey Montoya is a 57 y.o. [phone] who LMP was No LMP recorded. Patient is postmenopausal., presents today for her annual examination. Her menses are absent due to IUD and possibly age.  Dysmenorrhea none. She does not have intermenstrual bleeding.  Mirena placed 08/08/13. She is ready to have it removed.   She does have occas vasomotor sx.  Sex activity: single partner, contraception - IUD. She does have vaginal dryness. Has not tried lubricants.  Last Pap: 12/26/16  Results were: no abnormalities /neg HPV DNA.   Last mammogram: May 17, 2015  Results were: normal--routine follow-up in 12 months. Did not do mammo last yr. There is a FH of breast cancer in her PGM, genetic testing not indicated. There is no FH of ovarian cancer. The patient does not do self-breast exams.  Colonoscopy: colonoscopy 3 years ago without abnormalities. Hx of polyps. Repeat due in 5 yrs.  Tobacco use: The patient denies current or previous tobacco use. Alcohol use: social drinker Exercise: moderately active  She does not get adequate calcium and Vitamin D in her diet.  Labs with PCP in the past.   Past Medical History:  Diagnosis Date  . Colon polyps   . Kidney stones     Past Surgical History:  Procedure Laterality Date  . BREAST SURGERY  2014   biopsy  . COLONOSCOPY  02/2015  . WISDOM TOOTH EXTRACTION     THREE;  TEENAGER ON TWO AND 40s on the third when she got braces    Family History  Problem Relation Age of Onset  . Hypertension Mother   . Diabetes Mother        DIED 04/04/15  . Hypertension Father   . Breast cancer Paternal Grandmother     Social History   Socioeconomic History  . Marital status: Married    Spouse name: Not on file  . Number of children: Not on file  . Years of education: Not on file  . Highest education level: Not on file  Occupational History    . Not on file  Social Needs  . Financial resource strain: Not on file  . Food insecurity:    Worry: Not on file    Inability: Not on file  . Transportation needs:    Medical: Not on file    Non-medical: Not on file  Tobacco Use  . Smoking status: Never Smoker  . Smokeless tobacco: Never Used  Substance and Sexual Activity  . Alcohol use: Yes    Alcohol/week: 0.0 - 1.2 oz  . Drug use: No  . Sexual activity: Yes    Birth control/protection: IUD  Lifestyle  . Physical activity:    Days per week: Not on file    Minutes per session: Not on file  . Stress: Not on file  Relationships  . Social connections:    Talks on phone: Not on file    Gets together: Not on file    Attends religious service: Not on file    Active member of club or organization: Not on file    Attends meetings of clubs or organizations: Not on file    Relationship status: Not on file  . Intimate partner violence:    Fear of current or ex partner: Not on file    Emotionally abused: Not on file  Physically abused: Not on file    Forced sexual activity: Not on file  Other Topics Concern  . Not on file  Social History Narrative  . Not on file    Current Outpatient Medications on File Prior to Visit  Medication Sig Dispense Refill  . amoxicillin-clavulanate (AUGMENTIN) 875-125 MG tablet Take 1 tablet by mouth 2 (two) times daily. (Patient not taking: Reported on 02/18/2018) 10 tablet 0  . sulfamethoxazole-trimethoprim (BACTRIM DS,SEPTRA DS) 800-160 MG tablet Take 1 tablet by mouth 3 (three) times daily. (Patient not taking: Reported on 02/18/2018) 15 tablet 0   No current facility-administered medications on file prior to visit.       ROS:  Review of Systems  Constitutional: Negative for fatigue, fever and unexpected weight change.  Respiratory: Negative for cough, shortness of breath and wheezing.   Cardiovascular: Negative for chest pain, palpitations and leg swelling.  Gastrointestinal: Negative  for blood in stool, constipation, diarrhea, nausea and vomiting.  Endocrine: Negative for cold intolerance, heat intolerance and polyuria.  Genitourinary: Negative for dyspareunia, dysuria, flank pain, frequency, genital sores, hematuria, menstrual problem, pelvic pain, urgency, vaginal bleeding, vaginal discharge and vaginal pain.  Musculoskeletal: Negative for back pain, joint swelling and myalgias.  Skin: Negative for rash.  Neurological: Negative for dizziness, syncope, light-headedness, numbness and headaches.  Hematological: Negative for adenopathy.  Psychiatric/Behavioral: Negative for agitation, confusion, sleep disturbance and suicidal ideas. The patient is not nervous/anxious.      Objective: BP 120/80   Ht 5\' 4"  (1.626 m)   Wt 124 lb (56.2 kg)   BMI 21.28 kg/m    Physical Exam  Constitutional: She is oriented to person, place, and time. She appears well-developed and well-nourished.  Genitourinary: Vagina normal and uterus normal. There is no rash or tenderness on the right labia. There is no rash or tenderness on the left labia. No erythema or tenderness in the vagina. No vaginal discharge found. Right adnexum does not display mass and does not display tenderness. Left adnexum does not display mass and does not display tenderness. Cervix does not exhibit motion tenderness or polyp. Uterus is not enlarged or tender.  Neck: Normal range of motion. No thyromegaly present.  Cardiovascular: Normal rate, regular rhythm and normal heart sounds.  No murmur heard. Pulmonary/Chest: Effort normal and breath sounds normal. Right breast exhibits no mass, no nipple discharge, no skin change and no tenderness. Left breast exhibits no mass, no nipple discharge, no skin change and no tenderness.  Abdominal: Soft. There is no tenderness. There is no guarding.  Musculoskeletal: Normal range of motion.  Neurological: She is alert and oriented to person, place, and time. No cranial nerve deficit.    Psychiatric: She has a normal mood and affect. Her behavior is normal.  Vitals reviewed.   Assessment/Plan: Encounter for annual routine gynecological examination  Screening for breast cancer - Plan: MM DIGITAL SCREENING BILATERAL  Encounter for removal of intrauterine contraceptive device (IUD) - IUD removed today.   Postmenopause - Discussed perimenopause and postmenopause. Not sure where pt is due to Mirena IUD. F/u prn DUB.  Vaginal dryness, menopausal - Try lubricants/coconut oil.            GYN counsel breast self exam, mammography screening, menopause, adequate intake of calcium and vitamin D, diet and exercise     F/U  Return in about 1 year (around 02/19/2019).  Chuong Casebeer B. Damaria Vachon, PA-C 02/18/2018 2:49 PM

## 2018-02-18 NOTE — Patient Instructions (Signed)
I value your feedback and entrusting us with your care. If you get a [facility] patient survey, I would appreciate you taking the time to let us know about your experience today. Thank you!  [facility]: [phone]  [facility]: [phone]  

## 2018-05-13 ENCOUNTER — Ambulatory Visit
Admission: RE | Admit: 2018-05-13 | Discharge: 2018-05-13 | Disposition: A | Discharge status: Home / Self Care | Payer: 59 | Source: Ambulatory Visit | Attending: Obstetrics and Gynecology | Admitting: Obstetrics and Gynecology

## 2018-05-13 DIAGNOSIS — Z1231 Encounter for screening mammogram for malignant neoplasm of breast: Secondary | ICD-10-CM | POA: Diagnosis not present

## 2018-05-13 DIAGNOSIS — Z1239 Encounter for other screening for malignant neoplasm of breast: Secondary | ICD-10-CM

## 2018-05-20 ENCOUNTER — Encounter: Payer: Self-pay | Admitting: Obstetrics and Gynecology

## 2018-05-20 ENCOUNTER — Inpatient Hospital Stay
Admission: RE | Admit: 2018-05-20 | Discharge: 2018-05-20 | Disposition: A | Discharge status: Home / Self Care | Payer: Self-pay | Source: Ambulatory Visit | Attending: *Deleted | Admitting: *Deleted

## 2018-05-20 ENCOUNTER — Other Ambulatory Visit: Payer: Self-pay | Admitting: *Deleted

## 2018-05-20 DIAGNOSIS — Z9289 Personal history of other medical treatment: Secondary | ICD-10-CM

## 2019-10-28 ENCOUNTER — Encounter: Payer: 59 | Admitting: Nurse Practitioner

## 2020-10-12 ENCOUNTER — Other Ambulatory Visit: Payer: Self-pay | Admitting: Internal Medicine

## 2020-10-12 DIAGNOSIS — Z1231 Encounter for screening mammogram for malignant neoplasm of breast: Secondary | ICD-10-CM

## 2021-07-11 ENCOUNTER — Other Ambulatory Visit: Payer: Self-pay | Admitting: Family Medicine

## 2021-07-11 DIAGNOSIS — Z1231 Encounter for screening mammogram for malignant neoplasm of breast: Secondary | ICD-10-CM

## 2021-07-13 ENCOUNTER — Other Ambulatory Visit: Payer: Self-pay

## 2021-07-13 ENCOUNTER — Ambulatory Visit
Admission: RE | Admit: 2021-07-13 | Discharge: 2021-07-13 | Disposition: A | Discharge status: Home / Self Care | Payer: 59 | Source: Ambulatory Visit | Attending: Family Medicine | Admitting: Family Medicine

## 2021-07-13 DIAGNOSIS — Z1231 Encounter for screening mammogram for malignant neoplasm of breast: Secondary | ICD-10-CM | POA: Diagnosis present

## 2022-12-27 ENCOUNTER — Other Ambulatory Visit: Payer: Self-pay | Admitting: Internal Medicine

## 2022-12-27 DIAGNOSIS — Z1231 Encounter for screening mammogram for malignant neoplasm of breast: Secondary | ICD-10-CM

## 2023-01-22 ENCOUNTER — Ambulatory Visit
Admission: RE | Admit: 2023-01-22 | Discharge: 2023-01-22 | Disposition: A | Discharge status: Home / Self Care | Payer: BC Managed Care – PPO | Source: Ambulatory Visit | Attending: Internal Medicine | Admitting: Internal Medicine

## 2023-01-22 DIAGNOSIS — Z1231 Encounter for screening mammogram for malignant neoplasm of breast: Secondary | ICD-10-CM | POA: Insufficient documentation

## 2023-07-25 ENCOUNTER — Ambulatory Visit: Payer: BC Managed Care – PPO

## 2023-07-25 DIAGNOSIS — D122 Benign neoplasm of ascending colon: Secondary | ICD-10-CM | POA: Diagnosis not present

## 2023-07-25 DIAGNOSIS — Z860100 Personal history of colon polyps, unspecified: Secondary | ICD-10-CM | POA: Diagnosis not present

## 2023-07-25 DIAGNOSIS — D124 Benign neoplasm of descending colon: Secondary | ICD-10-CM | POA: Diagnosis not present

## 2023-07-25 DIAGNOSIS — Z1211 Encounter for screening for malignant neoplasm of colon: Secondary | ICD-10-CM | POA: Diagnosis present

## 2023-07-25 DIAGNOSIS — K5730 Diverticulosis of large intestine without perforation or abscess without bleeding: Secondary | ICD-10-CM | POA: Diagnosis not present

## 2023-07-25 DIAGNOSIS — K640 First degree hemorrhoids: Secondary | ICD-10-CM | POA: Diagnosis not present

## 2024-06-12 ENCOUNTER — Other Ambulatory Visit: Payer: Self-pay | Admitting: Internal Medicine

## 2024-06-12 DIAGNOSIS — Z1231 Encounter for screening mammogram for malignant neoplasm of breast: Secondary | ICD-10-CM

## 2024-07-13 ENCOUNTER — Ambulatory Visit
Admission: RE | Admit: 2024-07-13 | Discharge: 2024-07-13 | Disposition: A | Discharge status: Home / Self Care | Source: Ambulatory Visit | Attending: Internal Medicine | Admitting: Internal Medicine

## 2024-07-13 DIAGNOSIS — Z1231 Encounter for screening mammogram for malignant neoplasm of breast: Secondary | ICD-10-CM | POA: Diagnosis present
